# Patient Record
Sex: Female | Born: 1975 | Race: White | Hispanic: No
Health system: Southern US, Community
[De-identification: ages and names within clinical notes are randomized; demographics above are authoritative.]

## PROBLEM LIST (undated history)

## (undated) DIAGNOSIS — Z433 Encounter for attention to colostomy: Secondary | ICD-10-CM

## (undated) DIAGNOSIS — C189 Malignant neoplasm of colon, unspecified: Secondary | ICD-10-CM

## (undated) DIAGNOSIS — C50919 Malignant neoplasm of unspecified site of unspecified female breast: Secondary | ICD-10-CM

## (undated) HISTORY — PX: COLON SURGERY: SHX602

---

## 2004-01-05 ENCOUNTER — Emergency Department (HOSPITAL_COMMUNITY): Admission: EM | Admit: 2004-01-05 | Discharge: 2004-01-05 | Payer: Self-pay | Admitting: Emergency Medicine

## 2007-02-18 ENCOUNTER — Emergency Department (HOSPITAL_COMMUNITY): Admission: EM | Admit: 2007-02-18 | Discharge: 2007-02-18 | Payer: Self-pay | Admitting: Emergency Medicine

## 2007-03-07 ENCOUNTER — Emergency Department (HOSPITAL_COMMUNITY): Admission: EM | Admit: 2007-03-07 | Discharge: 2007-03-07 | Payer: Self-pay | Admitting: Emergency Medicine

## 2007-04-18 ENCOUNTER — Emergency Department (HOSPITAL_COMMUNITY): Admission: EM | Admit: 2007-04-18 | Discharge: 2007-04-18 | Payer: Self-pay | Admitting: Emergency Medicine

## 2007-04-29 ENCOUNTER — Emergency Department (HOSPITAL_COMMUNITY): Admission: EM | Admit: 2007-04-29 | Discharge: 2007-04-29 | Payer: Self-pay | Admitting: Emergency Medicine

## 2007-05-05 ENCOUNTER — Emergency Department (HOSPITAL_COMMUNITY): Admission: EM | Admit: 2007-05-05 | Discharge: 2007-05-05 | Payer: Self-pay | Admitting: Emergency Medicine

## 2010-12-11 LAB — I-STAT 8, (EC8 V) (CONVERTED LAB)
BUN: 6
Chloride: 107
Glucose, Bld: 82
Potassium: 4.2
TCO2: 22
pH, Ven: 7.34 — ABNORMAL HIGH

## 2010-12-11 LAB — URINALYSIS, ROUTINE W REFLEX MICROSCOPIC
Nitrite: NEGATIVE
Specific Gravity, Urine: 1.013
Urobilinogen, UA: 0.2
pH: 6

## 2010-12-11 LAB — POCT I-STAT CREATININE: Operator id: 161631

## 2010-12-11 LAB — URINE MICROSCOPIC-ADD ON

## 2019-02-26 ENCOUNTER — Other Ambulatory Visit: Payer: Self-pay

## 2019-02-26 ENCOUNTER — Emergency Department (HOSPITAL_COMMUNITY): Payer: Medicaid Other

## 2019-02-26 ENCOUNTER — Emergency Department (HOSPITAL_COMMUNITY)
Admission: EM | Admit: 2019-02-26 | Discharge: 2019-02-26 | Discharge status: Against Medical Advice | Payer: Medicaid Other | Attending: Emergency Medicine | Admitting: Emergency Medicine

## 2019-02-26 ENCOUNTER — Encounter (HOSPITAL_COMMUNITY): Payer: Self-pay | Admitting: Emergency Medicine

## 2019-02-26 DIAGNOSIS — R10815 Periumbilic abdominal tenderness: Secondary | ICD-10-CM | POA: Insufficient documentation

## 2019-02-26 DIAGNOSIS — L02211 Cutaneous abscess of abdominal wall: Secondary | ICD-10-CM | POA: Diagnosis not present

## 2019-02-26 DIAGNOSIS — Z5320 Procedure and treatment not carried out because of patient's decision for unspecified reasons: Secondary | ICD-10-CM | POA: Insufficient documentation

## 2019-02-26 DIAGNOSIS — Z853 Personal history of malignant neoplasm of breast: Secondary | ICD-10-CM | POA: Insufficient documentation

## 2019-02-26 DIAGNOSIS — Z79899 Other long term (current) drug therapy: Secondary | ICD-10-CM | POA: Insufficient documentation

## 2019-02-26 DIAGNOSIS — C19 Malignant neoplasm of rectosigmoid junction: Secondary | ICD-10-CM | POA: Insufficient documentation

## 2019-02-26 DIAGNOSIS — R1032 Left lower quadrant pain: Secondary | ICD-10-CM | POA: Diagnosis present

## 2019-02-26 HISTORY — DX: Encounter for attention to colostomy: Z43.3

## 2019-02-26 HISTORY — DX: Malignant neoplasm of unspecified site of unspecified female breast: C50.919

## 2019-02-26 HISTORY — DX: Malignant neoplasm of colon, unspecified: C18.9

## 2019-02-26 LAB — CBC WITH DIFFERENTIAL/PLATELET
Abs Immature Granulocytes: 0.02 10*3/uL (ref 0.00–0.07)
Basophils Absolute: 0 10*3/uL (ref 0.0–0.1)
Basophils Relative: 1 %
Eosinophils Absolute: 0.2 10*3/uL (ref 0.0–0.5)
Eosinophils Relative: 4 %
HCT: 39.3 % (ref 36.0–46.0)
Hemoglobin: 11.9 g/dL — ABNORMAL LOW (ref 12.0–15.0)
Immature Granulocytes: 0 %
Lymphocytes Relative: 28 %
Lymphs Abs: 1.6 10*3/uL (ref 0.7–4.0)
MCH: 28.4 pg (ref 26.0–34.0)
MCHC: 30.3 g/dL (ref 30.0–36.0)
MCV: 93.8 fL (ref 80.0–100.0)
Monocytes Absolute: 0.5 10*3/uL (ref 0.1–1.0)
Monocytes Relative: 9 %
Neutro Abs: 3.4 10*3/uL (ref 1.7–7.7)
Neutrophils Relative %: 58 %
Platelets: 400 10*3/uL (ref 150–400)
RBC: 4.19 MIL/uL (ref 3.87–5.11)
RDW: 12.7 % (ref 11.5–15.5)
WBC: 5.9 10*3/uL (ref 4.0–10.5)
nRBC: 0 % (ref 0.0–0.2)

## 2019-02-26 LAB — COMPREHENSIVE METABOLIC PANEL
ALT: 11 U/L (ref 0–44)
AST: 18 U/L (ref 15–41)
Albumin: 3.4 g/dL — ABNORMAL LOW (ref 3.5–5.0)
Alkaline Phosphatase: 97 U/L (ref 38–126)
Anion gap: 9 (ref 5–15)
BUN: 7 mg/dL (ref 6–20)
CO2: 25 mmol/L (ref 22–32)
Calcium: 9 mg/dL (ref 8.9–10.3)
Chloride: 104 mmol/L (ref 98–111)
Creatinine, Ser: 0.73 mg/dL (ref 0.44–1.00)
GFR calc Af Amer: >60 mL/min (ref >60)
GFR calc non Af Amer: >60 mL/min (ref >60)
Glucose, Bld: 101 mg/dL — ABNORMAL HIGH (ref 70–99)
Potassium: 4 mmol/L (ref 3.5–5.1)
Sodium: 138 mmol/L (ref 135–145)
Total Bilirubin: 0.4 mg/dL (ref 0.3–1.2)
Total Protein: 6.4 g/dL — ABNORMAL LOW (ref 6.5–8.1)

## 2019-02-26 LAB — I-STAT BETA HCG BLOOD, ED (MC, WL, AP ONLY): I-stat hCG, quantitative: <5 m[IU]/mL (ref <5)

## 2019-02-26 LAB — LACTIC ACID, PLASMA: Lactic Acid, Venous: 1.1 mmol/L (ref 0.5–1.9)

## 2019-02-26 MED ORDER — METRONIDAZOLE IN NACL 5-0.79 MG/ML-% IV SOLN
500.0000 mg | Freq: Once | INTRAVENOUS | Status: DC
  Filled 2019-02-26: qty 100

## 2019-02-26 MED ORDER — HYDROMORPHONE HCL 1 MG/ML IJ SOLN
1.0000 mg | Freq: Once | INTRAMUSCULAR | Status: AC
  Administered 2019-02-26: 1 mg via INTRAVENOUS
  Filled 2019-02-26: qty 1

## 2019-02-26 MED ORDER — SODIUM CHLORIDE 0.9 % IV SOLN
2.0000 g | Freq: Once | INTRAVENOUS | Status: DC
  Filled 2019-02-26: qty 2

## 2019-02-26 MED ORDER — IOHEXOL 300 MG/ML  SOLN
100.0000 mL | Freq: Once | INTRAMUSCULAR | Status: AC | PRN
  Administered 2019-02-26: 19:00:00 100 mL via INTRAVENOUS

## 2019-02-26 MED ORDER — SODIUM CHLORIDE 0.9 % IV SOLN
1000.0000 mL | INTRAVENOUS | Status: DC
  Administered 2019-02-26: 18:00:00 1000 mL via INTRAVENOUS

## 2019-02-26 MED ORDER — SODIUM CHLORIDE 0.9 % IV BOLUS (SEPSIS)
1000.0000 mL | Freq: Once | INTRAVENOUS | Status: AC
  Administered 2019-02-26: 18:00:00 1000 mL via INTRAVENOUS

## 2019-02-26 NOTE — ED Notes (Signed)
Pt stated that she would like to leave AMA. Dr. Tomi Bamberger was made aware and conversed with the patient. Pt still wanted to leave AMA and asked for her IV to be removed by this RN. At that moment GI doctor arrived and conversed with the pt. At that moment the pt agreed to a physical assessment by same provider.

## 2019-02-26 NOTE — ED Notes (Signed)
Pt Re-reeducated on need to hold pain medication until BP increases; Dr. Tomi Bamberger made of aware of patients BP. Still holding until it further increases.

## 2019-02-26 NOTE — ED Triage Notes (Signed)
Pt reports noticing a new wound on her abd with discharge. Recently had a wound vac to her abd and has colostomy bag. Pt reports being treated for colon cancer but her doctors are in [place].

## 2019-02-26 NOTE — ED Provider Notes (Addendum)
[facility] EMERGENCY DEPARTMENT Provider Note   CSN: [id number] Arrival date & time: 02/26/19  1528     History Chief Complaint  Patient presents with  . Wound Infection    Glenda Moreno is a 43 y.o. female.  HPI   Patient presents to the emergency room for evaluation of a new wound in her lower abdomen.  Patient states she has a history of colon cancer.  Patient states she previously had surgery and from her description she has residual cancer. She had surgery in November of this year.   Patient states she had a colostomy but is not currently on chemotherapy.  She was told she needs to start chemotherapy to shrink the tumor prior to additional surgery.  Patient resides in [place].  She came to visit her father who is currently hospitalized at at [facility] facility.  Patient states she had a wound VAC for persistent wound around her periumbilical region.  In the last week or so she was able to remove the wound VAC.  However she has noted ulceration or skin around the area of her colostomy site and is concerned she has developing infection.   She  Has persistent pain.  She is concerned she is going to run out of her pain medications.  Patient is requesting narcotic prescriptions when she leaves today.  No known fevers.  Occasional nausea vomiting.  No diarrhea.  No difficulty urinating although she has had issues in the past.  Past Medical History:  Diagnosis Date  . Breast cancer ([place])   . Colon cancer ([employer])   . Colostomy care [facility])     There are no problems to display for this patient.   Past Surgical History:  Procedure Laterality Date  . COLON SURGERY       OB History   No obstetric history on file.     No family history on file.  Social History   Tobacco Use  . Smoking status: Not on file  Substance Use Topics  . Alcohol use: Not on file  . Drug use: Not on file    Home Medications Prior to Admission medications    Medication Sig Start Date End Date Taking? Authorizing Provider  acetaminophen (TYLENOL) 500 MG tablet Take 1,000 mg by mouth daily.   Yes [provider]  ALPRAZolam Duanne Moron) 1 MG tablet Take 1 mg by mouth 3 (three) times daily as needed for anxiety.   Yes [provider]  levothyroxine (SYNTHROID) 125 MCG tablet Take 125 mcg by mouth daily. 09/25/13  Yes [provider]  methocarbamol (ROBAXIN) 500 MG tablet Take 500 mg by mouth every 8 (eight) hours as needed for muscle spasms.   Yes [provider]  morphine (MS CONTIN) 30 MG 12 hr tablet Take 30 mg by mouth every 12 (twelve) hours.   Yes [provider]  pantoprazole (PROTONIX) 40 MG tablet Take 40 mg by mouth daily.   Yes [provider]    Allergies    Penicillins  Review of Systems   Review of Systems  All other systems reviewed and are negative.   Physical Exam Updated Vital Signs BP 100/67   Pulse 69   Temp 97.8 F (36.6 C) (Oral)   Resp 16   Ht 1.829 m (6')   Wt 104.3 kg   SpO2 98%   BMI 31.19 kg/m   Physical Exam Vitals and nursing note reviewed.  Constitutional:  General: She is not in acute distress.    Appearance: She is well-developed.  HENT:     Head: Normocephalic and atraumatic.     Right Ear: External ear normal.     Left Ear: External ear normal.  Eyes:     General: No scleral icterus.       Right eye: No discharge.        Left eye: No discharge.     Conjunctiva/sclera: Conjunctivae normal.  Neck:     Trachea: No tracheal deviation.  Cardiovascular:     Rate and Rhythm: Normal rate and regular rhythm.  Pulmonary:     Effort: Pulmonary effort is normal. No respiratory distress.     Breath sounds: Normal breath sounds. No stridor. No wheezing or rales.  Abdominal:     General: Bowel sounds are normal. There is no distension.     Palpations: Abdomen is soft.     Tenderness: There is abdominal tenderness in the suprapubic area and left  lower quadrant. There is no guarding or rebound.     Comments: Ostomy left lower quadrant, small ulcerative wound approximately 1 cm around the 7 o'clock position of the origin of the colostomy bag  Musculoskeletal:        General: No tenderness.     Cervical back: Neck supple.  Skin:    General: Skin is warm and dry.     Findings: No rash.  Neurological:     Mental Status: She is alert.     Cranial Nerves: No cranial nerve deficit (no facial droop, extraocular movements intact, no slurred speech).     Sensory: No sensory deficit.     Motor: No abnormal muscle tone or seizure activity.     Coordination: Coordination normal.     ED Results / Procedures / Treatments   Labs (all labs ordered are listed, but only abnormal results are displayed) Labs Reviewed  COMPREHENSIVE METABOLIC PANEL - Abnormal; Notable for the following components:      Result Value   Glucose, Bld 101 (*)    Total Protein 6.4 (*)    Albumin 3.4 (*)    All other components within normal limits  CBC WITH DIFFERENTIAL/PLATELET - Abnormal; Notable for the following components:   Hemoglobin 11.9 (*)    All other components within normal limits  LACTIC ACID, PLASMA  LACTIC ACID, PLASMA  I-STAT BETA HCG BLOOD, ED ([facility], [facility], [facility] ONLY)    EKG None  Radiology DG Chest 2 View  Result Date: 02/26/2019 CLINICAL DATA:  Porta catheter placement. EXAM: CHEST - 2 VIEW COMPARISON:  None. FINDINGS: Normal sized heart. Small amount of linear density at both lung bases. Otherwise, clear lungs. Mild peribronchial thickening. Right subclavian porta catheter tip in the proximal superior vena cava. No pneumothorax. Unremarkable bones. Cholecystectomy clips. IMPRESSION: 1. Right subclavian porta catheter tip in the proximal superior vena cava. 2. Mild bibasilar linear atelectasis or scarring. 3. Mild bronchitic changes. Electronically Signed   By: Claudie Revering M.D.   On: 02/26/2019 16:56   CT ABDOMEN PELVIS W CONTRAST  Result  Date: 02/26/2019 CLINICAL DATA:  Abdominal abscess/infection suspected, recent wound VAC EXAM: CT ABDOMEN AND PELVIS WITH CONTRAST TECHNIQUE: Multidetector CT imaging of the abdomen and pelvis was performed using the standard protocol following bolus administration of intravenous contrast. CONTRAST:  168mL OMNIPAQUE IOHEXOL 300 MG/ML  SOLN COMPARISON:  None. FINDINGS: Lower chest: Atelectatic changes present in the lung bases with few bandlike areas of opacity likely reflecting atelectasis  or scarring. Hepatobiliary: No focal liver abnormality is seen. Patient is post cholecystectomy. Slight prominence of the biliary tree likely related to reservoir effect. No calcified intraductal gallstones. Pancreas: Unremarkable. No pancreatic ductal dilatation or surrounding inflammatory changes. Spleen: Normal in size without focal abnormality. Adrenals/Urinary Tract: Adrenal glands are unremarkable. Kidneys are normal, without renal calculi, focal lesion, or hydronephrosis. Bladder is unremarkable. Stomach/Bowel: Distal esophagus, stomach and duodenal sweep are unremarkable. No small bowel wall thickening or dilatation. Portion of small bowel protrudes into a left lower quadrant parastomal hernia sac but without features of obstruction or vascular compromise. Postsurgical changes from partial colectomy and end colostomy in the left lower quadrant with overlying stomal apparatus in place. Within the distal colorectal remnant is some asymmetric mural thickening along the left lateral wall extending from the 12:00 to 3:00 positions. This demonstrates some focal segmental narrowing (apple-core lesion) with surrounding stranding and mesorectal adenopathy. There is loss of clear definition along the anterior portion of this mass with posterior border of the uterus. Vascular/Lymphatic: Atherosclerotic plaque within the normal caliber aorta. Some reactive adenopathy noted in the upper abdomen as well as mild stranding of the omentum  which may be reactive or postsurgical. Few mesorectal lymph nodes are noted as well. Including a 12 mm node (3/80) Reproductive: Posterior border of the uterus is indistinct with the adjacent focally narrowed segment of the distal colorectal remnant mass. Other: There is an irregular rim enhancing air and fluid connection likely at the site of recent surgery with some hyperdense surgical material visualized this irregular collection measures approximately 7.9 x 2.4 x 7.9 cm in maximal transverse by AP by craniocaudal dimensions (3/74, 7/145). There is overlying skin thickening and subcutaneous fat infiltration. Circumferential body wall edema is noted most pronounced over the flanks and posterior soft tissues. Small amount of mesorectal fat stranding noted in the pelvis. No free intraperitoneal air. Peristomal herniation of fat and portion of the small bowel, as detailed above. Musculoskeletal: No acute osseous abnormality or suspicious osseous lesion. IMPRESSION: 1. There is an irregular rim enhancing air and fluid connection extending from the midline abdomen towards the stomal site along the left rectus sheath, concerning for a soft tissue abscess formation with overlying stranding and skin thickening worrisome for cellulitis. 2. There is some asymmetric mural thickening along the left lateral wall of the distal colorectal remnant with surrounding stranding and mesorectal adenopathy. This demonstrates some focal segmental narrowing (apple-core lesion) with loss of clear definition along the anterior portion of this mass with posterior border of the uterus. Findings are concerning for residual malignancy with mesorectal adenopathy. 3. Peristomal herniation of fat and portion of the small bowel without features of obstruction or vascular compromise. 4. Circumferential body wall edema most pronounced over the flanks and posterior soft tissues. 5.  Aortic Atherosclerosis (ICD10-I70.0). 6. These results were called by  telephone at the time of interpretation on 02/26/2019 at 7:47 pm to provider [facility] , who verbally acknowledged these results. Electronically Signed   By: Lovena Le M.D.   On: 02/26/2019 19:47    Procedures Procedures (including critical care time)  Medications Ordered in ED Medications  sodium chloride 0.9 % bolus 1,000 mL (0 mLs Intravenous Stopped 02/26/19 1901)    Followed by  0.9 %  sodium chloride infusion (1,000 mLs Intravenous New Bag/Given 02/26/19 1802)  ceFEPIme (MAXIPIME) 2 g in sodium chloride 0.9 % 100 mL IVPB (has no administration in time range)    And  metroNIDAZOLE (FLAGYL) IVPB 500  mg (has no administration in time range)  HYDROmorphone (DILAUDID) injection 1 mg (1 mg Intravenous Given 02/26/19 1933)  iohexol (OMNIPAQUE) 300 MG/ML solution 100 mL (100 mLs Intravenous Contrast Given 02/26/19 1911)    ED Course  I have reviewed the triage vital signs and the nursing notes.  Pertinent labs & imaging results that were available during my care of the patient were reviewed by me and considered in my medical decision making (see chart for details).  Clinical Course as of Feb 25 2038  Mon Feb 26, 2019  1708 120 oxycodone tablets 12/4, 60 dilaudid tablets 12/9; 28 tablets  30 mg Morphine on 12/16 prescribed.  Did discuss with pt initially that I would not be able to refill her opiate rx but we will treat her pain in th ED   [JK]  1944 CT scan findings reviewed with radiologist.  Patient has complicated findings of abscess as well with persistent findings of malignancy.   [JK]  1950 Case discussed with Dr. Gershon Crane.  He will evaluate the patient in the ED.  I discussed the findings with the patient.   [JK]  2039 Pt told us earlier she was going to leave ama but now she agrees to stay   [JK]    Clinical Course User Index [JK] Dorie Rank, MD   MDM Rules/Calculators/A&P                      Patient had borderline blood pressures but she has remained afebrile and does  not have a leukocytosis.  I doubt severe sepsis.  Patient's CT scan however unfortunately shows soft tissue skin infection and abscess.  Patient CT scan also shows findings consistent with persistent malignancy which correlates with a history the patient provided.  I have ordered doses of antibiotics.  I have consulted with Dr. Georgette Dover for further recommendations. He will see the patient in the ED in consultation  Final Clinical Impression(s) / ED Diagnoses Final diagnoses:  Abdominal wall abscess  Colorectal cancer [facility])    Rx / DC Orders ED Discharge Orders    None       Dorie Rank, MD 02/26/19 2018    Dorie Rank, MD 02/26/19 2039

## 2019-02-26 NOTE — ED Notes (Signed)
Per Dr. Tomi Bamberger, pts BP is stable enough to give pain medication

## 2019-02-26 NOTE — ED Notes (Signed)
Patient transported to X-ray 

## 2019-02-26 NOTE — ED Notes (Signed)
Pt visibly agitated at fact that I could not give pain medication with her current BP; pt opted to go to CT Scan prior to pain medication admin.

## 2019-02-26 NOTE — ED Notes (Signed)
Pt reeducated on need for BP to increase prior to pain medication being administered.

## 2019-02-26 NOTE — ED Notes (Signed)
ED Provider at bedside. 

## 2019-02-26 NOTE — ED Notes (Signed)
ED Provider and Consuing Provider were made aware of patients decision to leave AMA; After removal of IV pt decided to walk out instead of being let out in a wheelchair. Pt refused repeat physical assessment and repeat VS assessment.

## 2019-02-26 NOTE — ED Notes (Signed)
Patient transported to CT 

## 2019-02-26 NOTE — ED Notes (Signed)
Per Dr. Tomi Bamberger, Second Lactic Acid does not need to be redrawn.

## 2019-02-26 NOTE — Consult Note (Signed)
Reason for Consult:Abdominal wall abscess Referring Physician: Hillard Danker, MD  Glenda Moreno is an 43 y.o. female.  HPI: This is a 43 year old female who is visiting the area from [place].  We have no available records to review so all of the history comes from the patient.  Please note that the patient seems very frustrated and angry throughout the entire interview and keeps threatening to leave AMA.  Apparently, the patient states that she has been having blood in her bowel movements for over 2 years.  She mentioned this to her primary care physician but reportedly did not have a colonoscopy until November of this year.  Again there are no records to review to confirm this.  Apparently a tumor was found in the lower colon or upper rectum.  According to the patient, a Psychologist, sport and exercise at [facility] in [place] operated in November of this year.  Apparently the date of surgery was 01/14/2019.  Apparently, the surgeon encountered a fairly large tumor that had eroded into her uterus.  He did not complete a resection but performed a descending end colostomy.  Her wound was left open in the midline and a VAC was placed.  The midline incision has almost healed completely.  The patient was referred to oncology at [facility] in [facility].  She saw a "Asian doctor" in the oncology department and apparently the plan was to perform neoadjuvant chemotherapy to allow a complete surgical resection in the future.  The patient had a Port-A-Cath placed the next day in [place].  Apparently this appointment was just last week as the original port dressing remains in place.  However, the patient does not have much family in [place] and decided to come to [place] where she has more family support.  Over the last couple of days, she has developed more pain in the left side of her abdomen and has developed a small hole near the edge of her colostomy appliance.  This has begun to  drain some purulent drainage.  The main reason she came to the emergency department tonight was to get more narcotic pain medication.  She states that she takes Dilaudid 4 mg every few hours at home, although her med list shows MS Contin.  This was as much history as I could get from the patient, as she was very agitated and angry throughout the entire interview and examination.  Past Medical History:  Diagnosis Date  . Breast cancer ([place])   . Colon cancer ([place])   . Colostomy care [facility])     Past Surgical History:  Procedure Laterality Date  . COLON SURGERY      No family history on file.  Social History:  has no history on file for tobacco, alcohol, and drug.  Allergies:  Allergies  Allergen Reactions  . Penicillins Rash    Medications:  Prior to Admission medications   Medication Sig Start Date End Date Taking? Authorizing Provider  acetaminophen (TYLENOL) 500 MG tablet Take 1,000 mg by mouth daily.   Yes [provider]  ALPRAZolam Duanne Moron) 1 MG tablet Take 1 mg by mouth 3 (three) times daily as needed for anxiety.   Yes [provider]  levothyroxine (SYNTHROID) 125 MCG tablet Take 125 mcg by mouth daily. 09/25/13  Yes [provider]  methocarbamol (ROBAXIN) 500 MG tablet Take 500 mg by mouth every 8 (eight) hours as needed for muscle spasms.   Yes [provider]  morphine (MS CONTIN)  30 MG 12 hr tablet Take 30 mg by mouth every 12 (twelve) hours.   Yes [provider]  pantoprazole (PROTONIX) 40 MG tablet Take 40 mg by mouth daily.   Yes [provider]     Results for orders placed or performed during the hospital encounter of 02/26/19 (from the past 48 hour(s))  Lactic acid, plasma     Status: None   Collection Time: 02/26/19  4:34 PM  Result Value Ref Range   Lactic Acid, Venous 1.1 0.5 - 1.9 mmol/L    Comment: Performed at [facility], [place] [address], [place], [place] [address]  Comprehensive metabolic  panel     Status: Abnormal   Collection Time: 02/26/19  4:34 PM  Result Value Ref Range   Sodium 138 135 - 145 mmol/L   Potassium 4.0 3.5 - 5.1 mmol/L   Chloride 104 98 - 111 mmol/L   CO2 25 22 - 32 mmol/L   Glucose, Bld 101 (H) 70 - 99 mg/dL   BUN 7 6 - 20 mg/dL   Creatinine, Ser 0.73 0.44 - 1.00 mg/dL   Calcium 9.0 8.9 - 10.3 mg/dL   Total Protein 6.4 (L) 6.5 - 8.1 g/dL   Albumin 3.4 (L) 3.5 - 5.0 g/dL   AST 18 15 - 41 U/L   ALT 11 0 - 44 U/L   Alkaline Phosphatase 97 38 - 126 U/L   Total Bilirubin 0.4 0.3 - 1.2 mg/dL   GFR calc non Af Amer >60 >60 mL/min   GFR calc Af Amer >60 >60 mL/min   Anion gap 9 5 - 15    Comment: Performed at [facility] [address] [place], [place] [address]  CBC with Differential     Status: Abnormal   Collection Time: 02/26/19  4:34 PM  Result Value Ref Range   WBC 5.9 4.0 - 10.5 K/uL   RBC 4.19 3.87 - 5.11 MIL/uL   Hemoglobin 11.9 (L) 12.0 - 15.0 g/dL   HCT 39.3 36.0 - 46.0 %   MCV 93.8 80.0 - 100.0 fL   MCH 28.4 26.0 - 34.0 pg   MCHC 30.3 30.0 - 36.0 g/dL   RDW 12.7 11.5 - 15.5 %   Platelets 400 150 - 400 K/uL   nRBC 0.0 0.0 - 0.2 %   Neutrophils Relative % 58 %   Neutro Abs 3.4 1.7 - 7.7 K/uL   Lymphocytes Relative 28 %   Lymphs Abs 1.6 0.7 - 4.0 K/uL   Monocytes Relative 9 %   Monocytes Absolute 0.5 0.1 - 1.0 K/uL   Eosinophils Relative 4 %   Eosinophils Absolute 0.2 0.0 - 0.5 K/uL   Basophils Relative 1 %   Basophils Absolute 0.0 0.0 - 0.1 K/uL   Immature Granulocytes 0 %   Abs Immature Granulocytes 0.02 0.00 - 0.07 K/uL    Comment: Performed at [facility] [address] [place], [place] [address]  I-Stat beta hCG blood, ED     Status: None   Collection Time: 02/26/19  6:11 PM  Result Value Ref Range   I-stat hCG, quantitative <5.0 <5 mIU/mL   Comment 3            Comment:   GEST. AGE      CONC.  (mIU/mL)   <=1 WEEK        5 - 50     2 WEEKS       50 - 500  3 WEEKS       100 - 10,000     4 WEEKS      1,000 - 30,000        FEMALE AND NON-PREGNANT FEMALE:     LESS THAN 5 mIU/mL     DG Chest 2 View  Result Date: 02/26/2019 CLINICAL DATA:  Porta catheter placement. EXAM: CHEST - 2 VIEW COMPARISON:  None. FINDINGS: Normal sized heart. Small amount of linear density at both lung bases. Otherwise, clear lungs. Mild peribronchial thickening. Right subclavian porta catheter tip in the proximal superior vena cava. No pneumothorax. Unremarkable bones. Cholecystectomy clips. IMPRESSION: 1. Right subclavian porta catheter tip in the proximal superior vena cava. 2. Mild bibasilar linear atelectasis or scarring. 3. Mild bronchitic changes. Electronically Signed   By: Claudie Revering M.D.   On: 02/26/2019 16:56   CT ABDOMEN PELVIS W CONTRAST  Result Date: 02/26/2019 CLINICAL DATA:  Abdominal abscess/infection suspected, recent wound VAC EXAM: CT ABDOMEN AND PELVIS WITH CONTRAST TECHNIQUE: Multidetector CT imaging of the abdomen and pelvis was performed using the standard protocol following bolus administration of intravenous contrast. CONTRAST:  189mL OMNIPAQUE IOHEXOL 300 MG/ML  SOLN COMPARISON:  None. FINDINGS: Lower chest: Atelectatic changes present in the lung bases with few bandlike areas of opacity likely reflecting atelectasis or scarring. Hepatobiliary: No focal liver abnormality is seen. Patient is post cholecystectomy. Slight prominence of the biliary tree likely related to reservoir effect. No calcified intraductal gallstones. Pancreas: Unremarkable. No pancreatic ductal dilatation or surrounding inflammatory changes. Spleen: Normal in size without focal abnormality. Adrenals/Urinary Tract: Adrenal glands are unremarkable. Kidneys are normal, without renal calculi, focal lesion, or hydronephrosis. Bladder is unremarkable. Stomach/Bowel: Distal esophagus, stomach and duodenal sweep are unremarkable. No small bowel wall thickening or dilatation. Portion of small bowel protrudes into a left lower quadrant  parastomal hernia sac but without features of obstruction or vascular compromise. Postsurgical changes from partial colectomy and end colostomy in the left lower quadrant with overlying stomal apparatus in place. Within the distal colorectal remnant is some asymmetric mural thickening along the left lateral wall extending from the 12:00 to 3:00 positions. This demonstrates some focal segmental narrowing (apple-core lesion) with surrounding stranding and mesorectal adenopathy. There is loss of clear definition along the anterior portion of this mass with posterior border of the uterus. Vascular/Lymphatic: Atherosclerotic plaque within the normal caliber aorta. Some reactive adenopathy noted in the upper abdomen as well as mild stranding of the omentum which may be reactive or postsurgical. Few mesorectal lymph nodes are noted as well. Including a 12 mm node (3/80) Reproductive: Posterior border of the uterus is indistinct with the adjacent focally narrowed segment of the distal colorectal remnant mass. Other: There is an irregular rim enhancing air and fluid connection likely at the site of recent surgery with some hyperdense surgical material visualized this irregular collection measures approximately 7.9 x 2.4 x 7.9 cm in maximal transverse by AP by craniocaudal dimensions (3/74, 7/145). There is overlying skin thickening and subcutaneous fat infiltration. Circumferential body wall edema is noted most pronounced over the flanks and posterior soft tissues. Small amount of mesorectal fat stranding noted in the pelvis. No free intraperitoneal air. Peristomal herniation of fat and portion of the small bowel, as detailed above. Musculoskeletal: No acute osseous abnormality or suspicious osseous lesion. IMPRESSION: 1. There is an irregular rim enhancing air and fluid connection extending from the midline abdomen towards the stomal site along the left rectus sheath, concerning for a soft tissue abscess  formation with  overlying stranding and skin thickening worrisome for cellulitis. 2. There is some asymmetric mural thickening along the left lateral wall of the distal colorectal remnant with surrounding stranding and mesorectal adenopathy. This demonstrates some focal segmental narrowing (apple-core lesion) with loss of clear definition along the anterior portion of this mass with posterior border of the uterus. Findings are concerning for residual malignancy with mesorectal adenopathy. 3. Peristomal herniation of fat and portion of the small bowel without features of obstruction or vascular compromise. 4. Circumferential body wall edema most pronounced over the flanks and posterior soft tissues. 5.  Aortic Atherosclerosis (ICD10-I70.0). 6. These results were called by telephone at the time of interpretation on 02/26/2019 at 7:47 pm to provider [facility] , who verbally acknowledged these results. Electronically Signed   By: Lovena Le M.D.   On: 02/26/2019 19:47    Review of Systems  HENT: Negative for ear discharge, ear pain, hearing loss and tinnitus.   Eyes: Negative for photophobia and pain.  Respiratory: Negative for cough and shortness of breath.   Cardiovascular: Negative for chest pain.  Gastrointestinal: Positive for abdominal pain and blood in stool. Negative for nausea and vomiting.  Genitourinary: Negative for dysuria, flank pain, frequency and urgency.  Musculoskeletal: Negative for back pain, myalgias and neck pain.  Neurological: Negative for dizziness and headaches.  Hematological: Does not bruise/bleed easily.  Psychiatric/Behavioral: The patient is not nervous/anxious.    Blood pressure 100/67, pulse 69, temperature 97.8 F (36.6 C), temperature source Oral, resp. rate 16, height 6' (1.829 m), weight 104.3 kg, SpO2 98 %.  PE;    WDWN; agitated, irritated; angry, speaking very loudly Eyes:  Pupils equal, round; sclera anicteric HENT:  Oral mucosa moist; good dentition  Neck:  No masses  palpated, no thyromegaly Lungs:  CTA bilaterally; normal respiratory effort CV:  Regular rate and rhythm; no murmurs; extremities well-perfused with no edema Abd:  +bowel sounds, obese, healed midline incision with small area of granulation tissue just to the left of midline.  The ostomy sits in a fold in her abdomen.  The ostomy appears pink and viable, but is flush with the skin.  She states that she frequently develops leaks.  At the lower medial corner of the ostomy appliance, there is a 1 cm opening with some purulent drainage and some surrounding erythema.  This area is tender. Skin:  Warm, dry; no sign of jaundice Psychiatric - alert and oriented x 4; calm mood and affect   Assessment/Plan: 1.  Colorectal cancer - apparently with extension into the uterus.  Not completely resected. 2.  Diverting end colostomy - partially retracted, causing some ostomy care issues.  3.  Parastomal hernia containing non-obstructed small bowel 4.  Midline incision that healed by secondary intention, now with apparent 7.9 x 2 x 7.9 cm subcutaneous abscess tracking from the midline towards the colostomy.  I recommended admission to the hospital to begin IV antibiotics, hydration, and pain control.  We would plan to bring her to the operating room for debridement of the abdominal abscess and possible ostomy revision/ relocation.  If she plans on staying in the area, we can consult our oncologists and try to obtain records from [facility] to begin neoadjuvant chemo.    The patient refuses to stay for admission and has signed herself out AMA.  Before she left, I explained to her that the infection would probably get worse and would make it impossible to keep an ostomy appliance around her colostomy  if the skin continues breaking down.    Imogene Burn Kamaiya Antilla 02/26/2019, 8:48 PM

## 2019-02-26 NOTE — ED Notes (Signed)
Consulting Provider finished speaking with the patient and the patients still states she would not like to be admitted and stay.

## 2021-04-28 IMAGING — CT CT ABD-PELV W/ CM
2 of 5 series · 15 of 46 positions shown, 17 images · IV contrast (omnipaque)
Comparison: None.

CLINICAL DATA: Abdominal abscess/infection suspected, recent wound
VAC

EXAM:
CT ABDOMEN AND PELVIS WITH CONTRAST
TECHNIQUE: Multidetector CT imaging of the abdomen and pelvis was performed
using the standard protocol following bolus administration of
intravenous contrast.
CONTRAST:  100mL OMNIPAQUE IOHEXOL 300 MG/ML  SOLN

[Series 3: a/p w/ 5mm · axial · 0.98mm/px · z∈[-554,-74]mm · 12 of 108 slices shown, 14 images]
[im 6/108  soft-tissue]
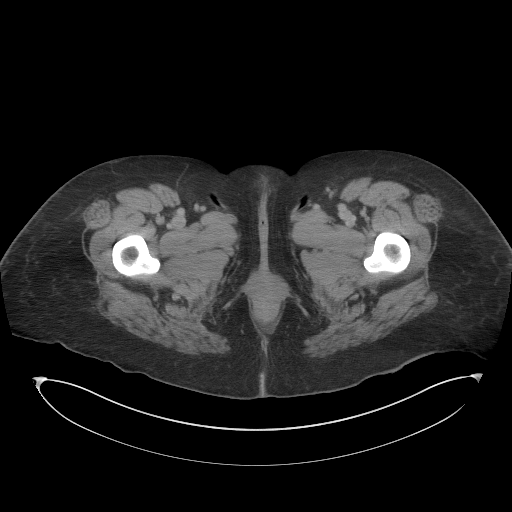
[im 6/108  bone]
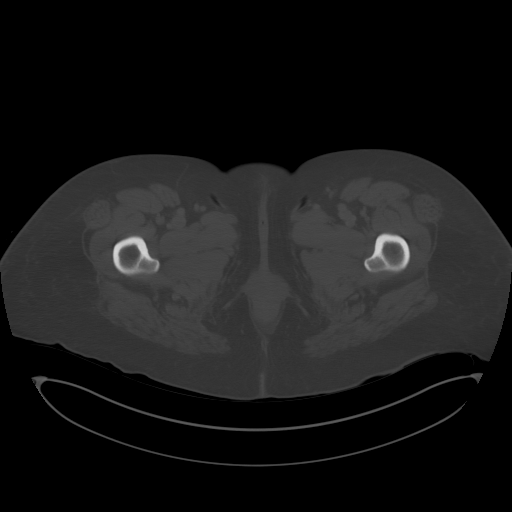
[im 18/108  soft-tissue]
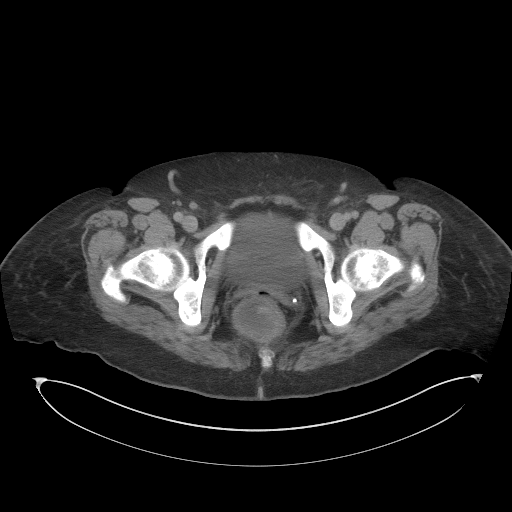
[im 24/108  soft-tissue]
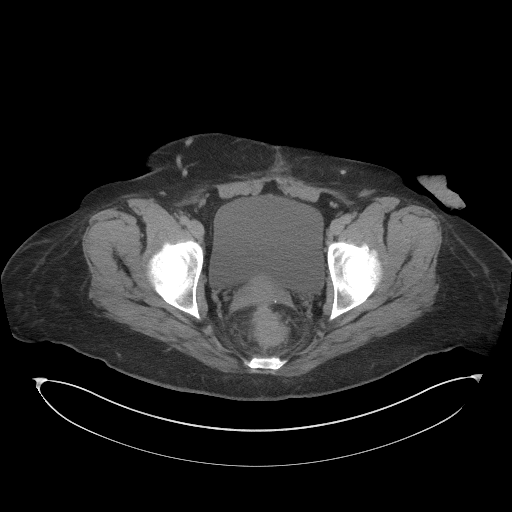
[im 30/108  soft-tissue]
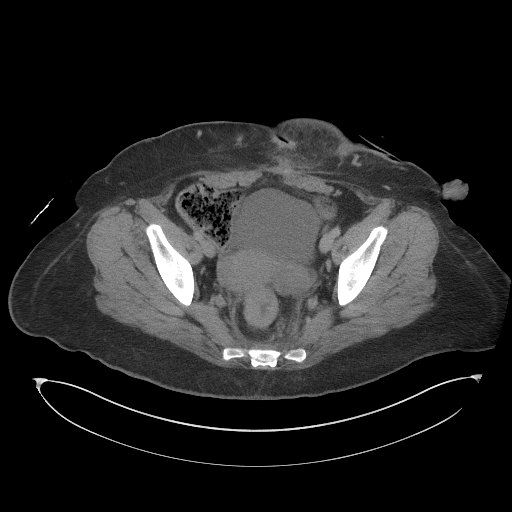
[im 42/108  soft-tissue]
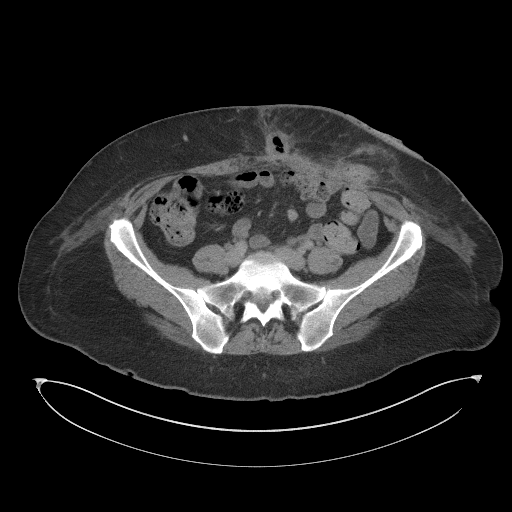
[im 48/108  soft-tissue]
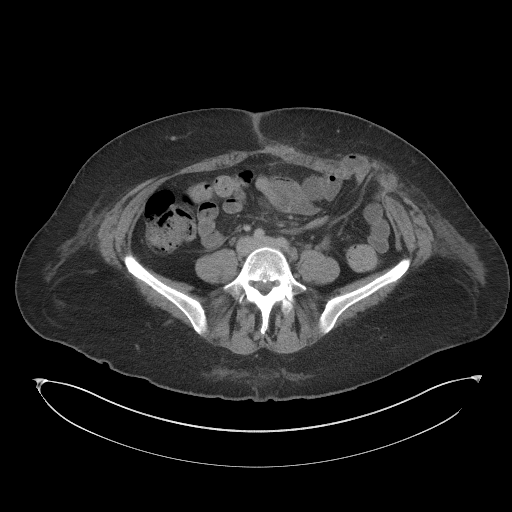
[im 60/108  soft-tissue]
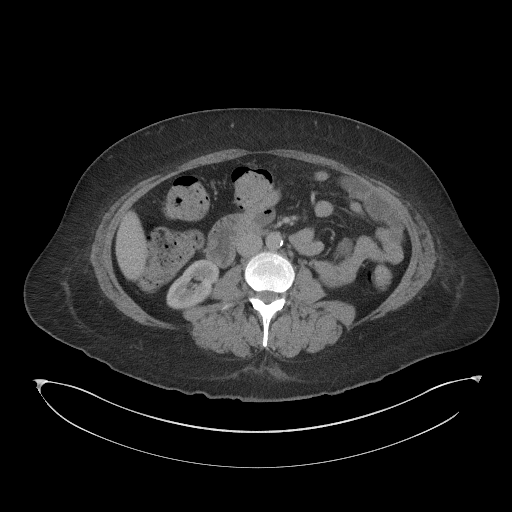
[im 66/108  soft-tissue]
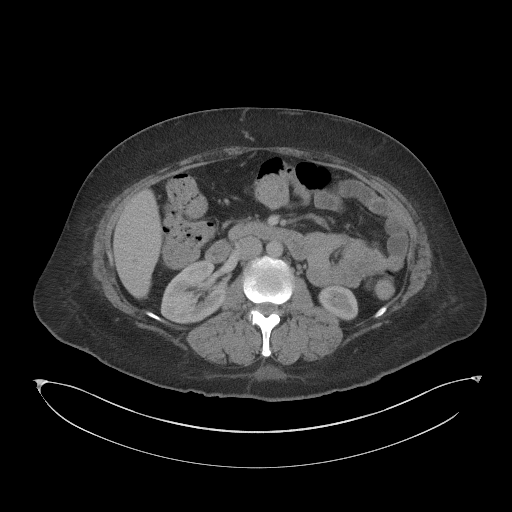
[im 78/108  soft-tissue]
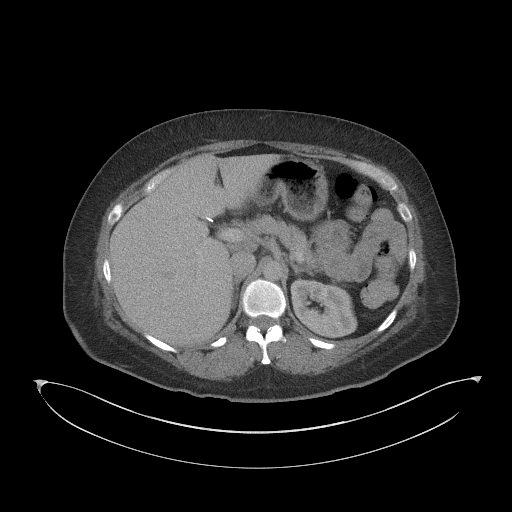
[im 78/108  bone]
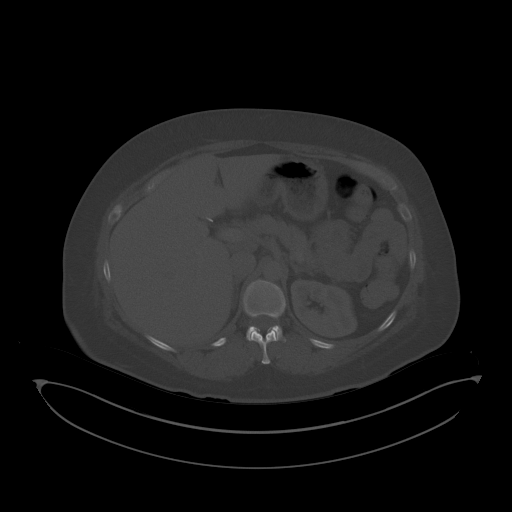
[im 84/108  soft-tissue]
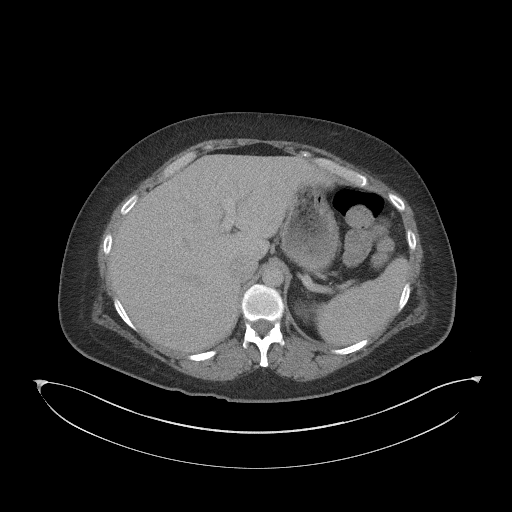
[im 90/108  soft-tissue]
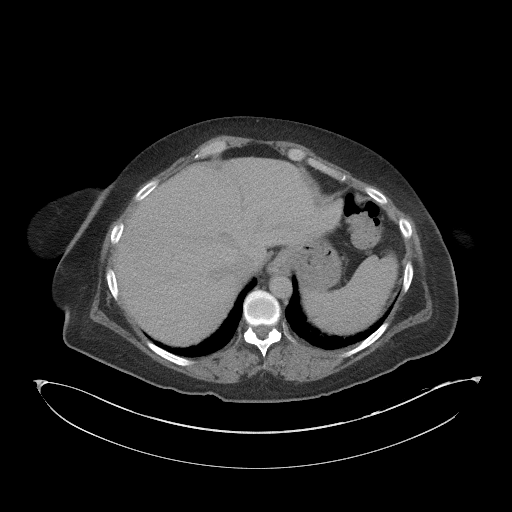
[im 102/108  soft-tissue]
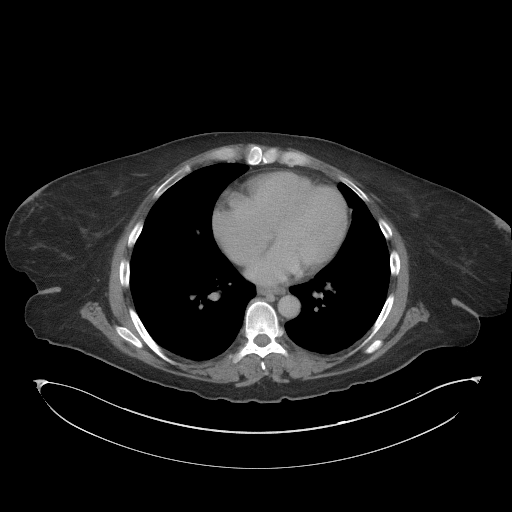

[Series 6: a/p w/ cor · coronal · 1.05mm/px · 3 of 162 slices shown]
[im 54/162  soft-tissue]
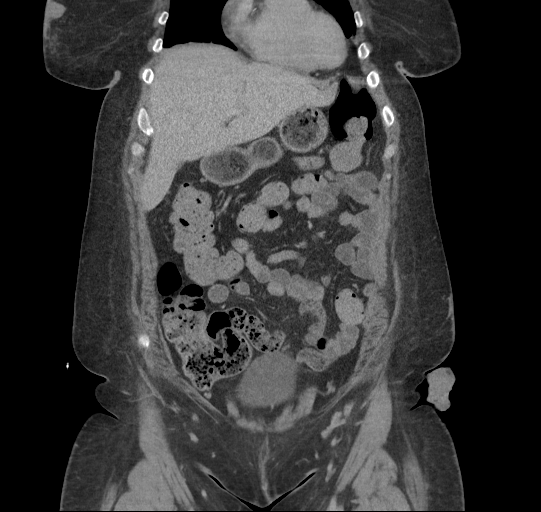
[im 72/162  soft-tissue]
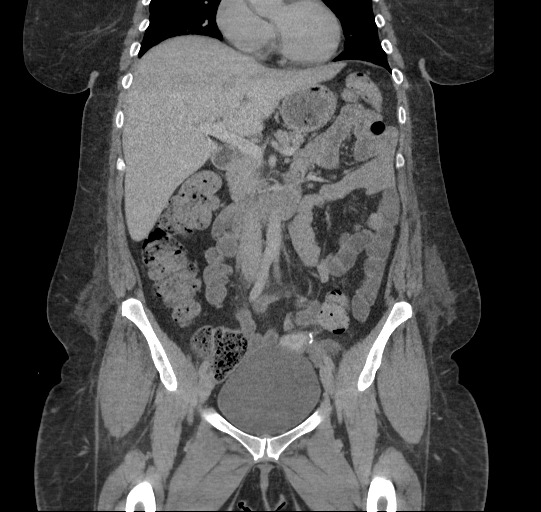
[im 90/162  soft-tissue]
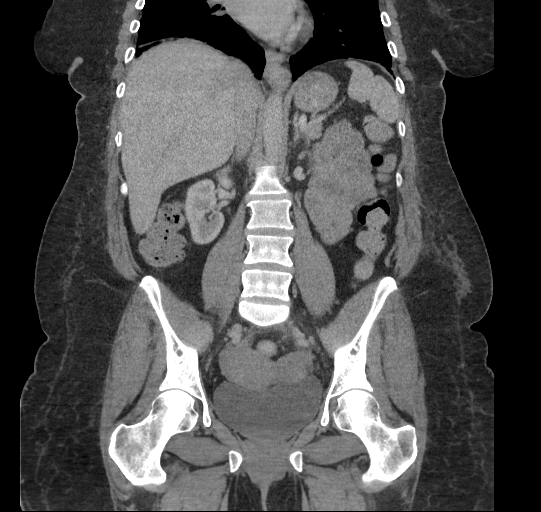

[15 of 46 positions shown; findings below may reference images not displayed]

FINDINGS: Lower chest: Atelectatic changes present in the lung bases with few
bandlike areas of opacity likely reflecting atelectasis or scarring.

Hepatobiliary: No focal liver abnormality is seen. Patient is post
cholecystectomy. Slight prominence of the biliary tree likely
related to reservoir effect. No calcified intraductal gallstones.

Pancreas: Unremarkable. No pancreatic ductal dilatation or
surrounding inflammatory changes.

Spleen: Normal in size without focal abnormality.

Adrenals/Urinary Tract: Adrenal glands are unremarkable. Kidneys are
normal, without renal calculi, focal lesion, or hydronephrosis.
Bladder is unremarkable.

Stomach/Bowel: Distal esophagus, stomach and duodenal sweep are
unremarkable. No small bowel wall thickening or dilatation. Portion
of small bowel protrudes into a left lower quadrant parastomal
hernia sac but without features of obstruction or vascular
compromise. Postsurgical changes from partial colectomy and end
colostomy in the left lower quadrant with overlying stomal apparatus
in place. Within the distal colorectal remnant is some asymmetric
mural thickening along the left lateral wall extending from the
[DATE] to [DATE] positions. This demonstrates some focal segmental
narrowing (apple-core lesion) with surrounding stranding and
mesorectal adenopathy. There is loss of clear definition along the
anterior portion of this mass with posterior border of the uterus.

Vascular/Lymphatic: Atherosclerotic plaque within the normal caliber
aorta. Some reactive adenopathy noted in the upper abdomen as well
as mild stranding of the omentum which may be reactive or
postsurgical. Few mesorectal lymph nodes are noted as well.
Including a 12 mm node (3/80)

Reproductive: Posterior border of the uterus is indistinct with the
adjacent focally narrowed segment of the distal colorectal remnant
mass.

Other: There is an irregular rim enhancing air and fluid connection
likely at the site of recent surgery with some hyperdense surgical
material visualized this irregular collection measures approximately
7.9 x 2.4 x 7.9 cm in maximal transverse by AP by craniocaudal
dimensions (3/74, 7/145). There is overlying skin thickening and
subcutaneous fat infiltration. Circumferential body wall edema is
noted most pronounced over the flanks and posterior soft tissues.
Small amount of mesorectal fat stranding noted in the pelvis. No
free intraperitoneal air. Peristomal herniation of fat and portion
of the small bowel, as detailed above.

Musculoskeletal: No acute osseous abnormality or suspicious osseous
lesion.
IMPRESSION: 1. There is an irregular rim enhancing air and fluid connection
extending from the midline abdomen towards the stomal site along the
left rectus sheath, concerning for a soft tissue abscess formation
with overlying stranding and skin thickening worrisome for
cellulitis.
2. There is some asymmetric mural thickening along the left lateral
wall of the distal colorectal remnant with surrounding stranding and
mesorectal adenopathy. This demonstrates some focal segmental
narrowing (apple-core lesion) with loss of clear definition along
the anterior portion of this mass with posterior border of the
uterus. Findings are concerning for residual malignancy with
mesorectal adenopathy.
3. Peristomal herniation of fat and portion of the small bowel
without features of obstruction or vascular compromise.
4. Circumferential body wall edema most pronounced over the flanks
and posterior soft tissues.
5.  Aortic Atherosclerosis ([id number]).
6. These results were called by telephone at the time of
interpretation on 02/26/2019 at [DATE] to provider ALO KHAN GAKUSEE , who
verbally acknowledged these results.
